# Patient Record
Sex: Female | Born: 1995
Health system: Southern US, Community
[De-identification: ages and names within clinical notes are randomized; demographics above are authoritative.]

---
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
## 2017-03-18 MED FILL — CAZIANT 28 DAY TABLET: 0.1/0.125/0 | 84 days supply | Qty: 84 | Fill #4

## 2017-06-15 DIAGNOSIS — Z309 Encounter for contraceptive management, unspecified: Secondary | ICD-10-CM | POA: Diagnosis not present

## 2017-06-15 MED FILL — CAZIANT 28 DAY TABLET: 0.1/0.125/0 | 84 days supply | Qty: 84 | Fill #0

## 2017-07-16 DIAGNOSIS — Z0000 Encounter for general adult medical examination without abnormal findings: Secondary | ICD-10-CM | POA: Diagnosis not present

## 2017-07-16 DIAGNOSIS — Z309 Encounter for contraceptive management, unspecified: Secondary | ICD-10-CM | POA: Diagnosis not present

## 2017-08-30 MED FILL — CAZIANT 28 DAY TABLET: 0.1/0.125/0 | 84 days supply | Qty: 84 | Fill #1

## 2017-11-22 MED FILL — CAZIANT 28 DAY TABLET: 0.1/0.125/0 | 84 days supply | Qty: 84 | Fill #2

## 2018-02-14 MED FILL — CAZIANT 28 DAY TABLET: 0.1/0.125/0 | 84 days supply | Qty: 84 | Fill #3

## 2018-05-06 MED FILL — VELIVET 28 DAY TABLET: 0.1/0.125/0 | 84 days supply | Qty: 84 | Fill #0

## 2018-08-02 DIAGNOSIS — Z309 Encounter for contraceptive management, unspecified: Secondary | ICD-10-CM | POA: Diagnosis not present

## 2018-08-02 DIAGNOSIS — Z0000 Encounter for general adult medical examination without abnormal findings: Secondary | ICD-10-CM | POA: Diagnosis not present

## 2018-08-02 DIAGNOSIS — Z23 Encounter for immunization: Secondary | ICD-10-CM | POA: Diagnosis not present

## 2018-08-02 MED FILL — VELIVET 28 DAY TABLET: 0.1/0.125/0 | 84 days supply | Qty: 84 | Fill #0

## 2018-10-19 MED FILL — VELIVET 28 DAY TABLET: 0.1/0.125/0 | 84 days supply | Qty: 84 | Fill #1

## 2018-12-02 DIAGNOSIS — Z6841 Body Mass Index (BMI) 40.0 and over, adult: Secondary | ICD-10-CM | POA: Diagnosis not present

## 2018-12-02 DIAGNOSIS — N910 Primary amenorrhea: Secondary | ICD-10-CM | POA: Diagnosis not present

## 2019-01-10 MED FILL — VELIVET 28 DAY TABLET: 0.1/0.125/0 | 84 days supply | Qty: 84 | Fill #2

## 2019-04-04 MED FILL — VELIVET 28 DAY TABLET: 0.1/0.125/0 | 84 days supply | Qty: 84 | Fill #3

## 2019-06-27 MED FILL — VELIVET 28 DAY TABLET: 0.1/0.125/0 | 84 days supply | Qty: 84 | Fill #0

## 2019-08-04 DIAGNOSIS — Z0000 Encounter for general adult medical examination without abnormal findings: Secondary | ICD-10-CM | POA: Diagnosis not present

## 2019-08-04 DIAGNOSIS — Z6841 Body Mass Index (BMI) 40.0 and over, adult: Secondary | ICD-10-CM | POA: Diagnosis not present

## 2019-08-04 DIAGNOSIS — Z309 Encounter for contraceptive management, unspecified: Secondary | ICD-10-CM | POA: Diagnosis not present

## 2019-08-14 DIAGNOSIS — Z13220 Encounter for screening for lipoid disorders: Secondary | ICD-10-CM | POA: Diagnosis not present

## 2019-08-14 DIAGNOSIS — Z0000 Encounter for general adult medical examination without abnormal findings: Secondary | ICD-10-CM | POA: Diagnosis not present

## 2019-08-14 DIAGNOSIS — Z23 Encounter for immunization: Secondary | ICD-10-CM | POA: Diagnosis not present

## 2019-09-19 MED FILL — VELIVET 28 DAY TABLET: 0.1/0.125/0 | 84 days supply | Qty: 84 | Fill #0

## 2019-10-09 DIAGNOSIS — R946 Abnormal results of thyroid function studies: Secondary | ICD-10-CM | POA: Diagnosis not present

## 2019-10-17 ENCOUNTER — Other Ambulatory Visit: Payer: Self-pay | Admitting: Physician Assistant

## 2019-10-17 ENCOUNTER — Other Ambulatory Visit (HOSPITAL_COMMUNITY): Payer: Self-pay | Admitting: Physician Assistant

## 2019-10-17 DIAGNOSIS — R946 Abnormal results of thyroid function studies: Secondary | ICD-10-CM

## 2019-11-06 ENCOUNTER — Ambulatory Visit (HOSPITAL_COMMUNITY): Payer: 59

## 2019-11-06 ENCOUNTER — Other Ambulatory Visit (HOSPITAL_COMMUNITY): Payer: Self-pay

## 2019-11-07 ENCOUNTER — Other Ambulatory Visit (HOSPITAL_COMMUNITY): Payer: Self-pay

## 2019-11-13 ENCOUNTER — Encounter (HOSPITAL_COMMUNITY)
Admission: RE | Admit: 2019-11-13 | Discharge: 2019-11-13 | Disposition: A | Discharge status: Home / Self Care | Payer: 59 | Source: Ambulatory Visit | Attending: Physician Assistant | Admitting: Physician Assistant

## 2019-11-13 ENCOUNTER — Other Ambulatory Visit: Payer: Self-pay

## 2019-11-13 DIAGNOSIS — R946 Abnormal results of thyroid function studies: Secondary | ICD-10-CM | POA: Insufficient documentation

## 2019-11-13 MED ORDER — SODIUM IODIDE I 131 CAPSULE
12.0000 | Freq: Once | INTRAVENOUS | Status: AC | PRN
  Administered 2019-11-13: 12 via ORAL

## 2019-11-14 ENCOUNTER — Encounter (HOSPITAL_COMMUNITY)
Admission: RE | Admit: 2019-11-14 | Discharge: 2019-11-14 | Disposition: A | Discharge status: Home / Self Care | Payer: 59 | Source: Ambulatory Visit | Attending: Physician Assistant | Admitting: Physician Assistant

## 2019-11-14 DIAGNOSIS — E0590 Thyrotoxicosis, unspecified without thyrotoxic crisis or storm: Secondary | ICD-10-CM | POA: Diagnosis not present

## 2019-11-14 MED ORDER — SODIUM PERTECHNETATE TC 99M INJECTION
10.9000 | Freq: Once | INTRAVENOUS | Status: AC
  Administered 2019-11-14: 10.9 via INTRAVENOUS

## 2019-11-27 DIAGNOSIS — N62 Hypertrophy of breast: Secondary | ICD-10-CM | POA: Diagnosis not present

## 2019-11-27 DIAGNOSIS — M546 Pain in thoracic spine: Secondary | ICD-10-CM | POA: Diagnosis not present

## 2020-01-11 ENCOUNTER — Other Ambulatory Visit: Payer: Self-pay

## 2020-01-12 NOTE — Progress Notes (Signed)
Name: Brittany Gross  MRN/ DOB: [id number], 1996/01/13    Age/ Sex: 24 y.o., female    PCP: Milus Height, PA   Reason for Endocrinology Evaluation: Hyperthyroidism     Date of Initial Endocrinology Evaluation: 01/15/2020     HPI: Brittany Gross is a 24 y.o. female with unremarkable past medical history. The patient presented for initial endocrinology clinic visit on 01/15/2020 for consultative assistance with her hyperthyroidism   Pt presented to her PCP for a routine check up in 07/2019 and was noted to have a low TSH at 0.24 uIu/ml , which prompted a thyroid uptake and scan with an elevated 24-hour uptake at 33.1%.  Patient denied any hyperthyroid symptoms at the time.  Today she continues to deny weight loss, heat intolerance, or diarrhea.  She has occasional palpitations and chronic anxiety.  Denies any local neck symptoms or eye symptoms. Maternal grandmother and maternal aunt with thyroid disease.     HISTORY:   Past Medical History: No past medical history on file.  Past Surgical History:    Social History:  reports that she has never smoked. She has never used smokeless tobacco. She reports current alcohol use. She reports that she does not use drugs.  Family History: family history includes Hyperthyroidism in her maternal grandmother.   HOME MEDICATIONS: Allergies as of 01/15/2020   No Known Allergies     Medication List    as of January 15, 2020  3:16 PM   You have not been prescribed any medications.       REVIEW OF SYSTEMS: A comprehensive ROS was conducted with the patient and is negative except as per HPI    OBJECTIVE:  VS: BP 118/78 (BP Location: Right Arm, Patient Position: Sitting, Cuff Size: Large)   Pulse 86   Temp 98 F (36.7 C)   Ht 5\' 4"  (1.626 m)   Wt 245 lb 6.4 oz (111.3 kg)   SpO2 98%   BMI 42.12 kg/m    Wt Readings from Last 3 Encounters:  01/15/20 245 lb 6.4 oz (111.3 kg)     EXAM: General: Pt appears well and is in  NAD  Eyes: External eye exam normal without stare, lid lag or exophthalmos.  EOM intact.    Neck: General: Supple without adenopathy. Thyroid: Thyroid size enlarged~ 60 grams.  No nodules appreciated. No thyroid bruit.  Lungs: Clear with good BS bilat with no rales, rhonchi, or wheezes  Heart: Auscultation: RRR.  Abdomen: Normoactive bowel sounds, soft, nontender, without masses or organomegaly palpable  Extremities:  BL LE: No pretibial edema normal ROM and strength.  Skin: Hair: Texture and amount normal with gender appropriate distribution Skin Inspection: No rashes Skin Palpation: Skin temperature, texture, and thickness normal to palpation  Neuro: Cranial nerves: II - XII grossly intact  Motor: Normal strength throughout DTRs: 2+ and symmetric in UE without delay in relaxation phase  Mental Status: Judgment, insight: Intact Orientation: Oriented to time, place, and person Mood and affect: No depression, anxiety, or agitation     DATA REVIEWED:   Results for Brittany Gross (MRN Vicenta Aly) as of 01/16/2020 10:29  Ref. Range 01/15/2020 08:04  Sodium Latest Ref Range: 135 - 145 mEq/L 139  Potassium Latest Ref Range: 3.5 - 5.1 mEq/L 4.2  Chloride Latest Ref Range: 96 - 112 mEq/L 106  CO2 Latest Ref Range: 19 - 32 mEq/L 28  Glucose Latest Ref Range: 70 - 99 mg/dL 95  BUN Latest Ref Range: 6 -  23 mg/dL 7  Creatinine Latest Ref Range: 0.40 - 1.20 mg/dL 0.35  Calcium Latest Ref Range: 8.4 - 10.5 mg/dL 9.6  Alkaline Phosphatase Latest Ref Range: 39 - 117 U/L 61  Albumin Latest Ref Range: 3.5 - 5.2 g/dL 3.8  AST Latest Ref Range: 0 - 37 U/L 17  ALT Latest Ref Range: 0 - 35 U/L 12  Total Protein Latest Ref Range: 6.0 - 8.3 g/dL 7.0  Total Bilirubin Latest Ref Range: 0.2 - 1.2 mg/dL 0.3  GFR Latest Ref Range: >60.00 mL/min 135.91  WBC Latest Ref Range: 4.0 - 10.5 K/uL 5.2  RBC Latest Ref Range: 3.87 - 5.11 Mil/uL 4.45  Hemoglobin Latest Ref Range: 12.0 - 15.0 g/dL 00.9 (L)  HCT  Latest Ref Range: 36.0 - 46.0 % 35.4 (L)  MCV Latest Ref Range: 78.0 - 100.0 fl 79.6  MCHC Latest Ref Range: 30.0 - 36.0 g/dL 38.1  RDW Latest Ref Range: 11.5 - 15.5 % 16.9 (H)  Platelets Latest Ref Range: 150.0 - 400.0 K/uL 305.0  Neutrophils Latest Ref Range: 43.0 - 77.0 % 36.2 (L)  Lymphocytes Latest Ref Range: 12.0 - 46.0 % 54.9 (H)  Monocytes Relative Latest Ref Range: 3.0 - 12.0 % 7.6  Eosinophil Latest Ref Range: 0.0 - 5.0 % 0.8  Basophil Latest Ref Range: 0.0 - 3.0 % 0.5  NEUT# Latest Ref Range: 1.4 - 7.7 K/uL 1.9  Lymphocyte # Latest Ref Range: 0.7 - 4.0 K/uL 2.9  Monocyte # Latest Ref Range: 0.1 - 1.0 K/uL 0.4  Eosinophils Absolute Latest Ref Range: 0.0 - 0.7 K/uL 0.0  Basophils Absolute Latest Ref Range: 0.0 - 0.1 K/uL 0.0  TSH Latest Ref Range: 0.35 - 4.50 uIU/mL 0.32 (L)  Triiodothyronine (T3) Latest Ref Range: 76 - 181 ng/dL 829  H3,ZJIR(CVELFY) Latest Ref Range: 0.60 - 1.60 ng/dL 1.01    7/51/0258 TSH 5.27 uIU/mL     Thyroid uptake and scan 11/14/2019 4 hour I-131 uptake = 19.3% (normal 5-15%)  24 hour I-131 uptake = 33.1% (normal 10-30%)   Old records , labs and images have been reviewed.   ASSESSMENT/PLAN/RECOMMENDATIONS:   1. 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   We discussed with pt the benefits of methimazole in the Tx of hyperthyroidism, as well as the possible side effects/complications of anti-thyroid drug Tx (specifically detailing the rare, but serious side effect of agranulocytosis). She was informed of need for regular thyroid function monitoring while on methimazole to ensure appropriate dosage without over-treatment. As well, we discussed the possible side effects of methimazole including the chance of rash, the small chance of liver irritation/juandice and the <=1 in  300-400 chance of sudden onset agranulocytosis.  We discussed importance of going to ED promptly (and stopping methimazole) if shewere to develop significant fever with severe sore throat of other evidence of acute infection.     We extensively discussed the various treatment options for hyperthyroidism and Graves disease including ablation therapy with radioactive iodine versus antithyroid drug treatment versus surgical therapy.  We recommended to the patient that we felt, at this time, that  NO  therapy would be most optimal.  Since she has no symptoms and her TSH is minimally below normal.      Follow-up in 4 months  Addendum: discussed results with the pt 01/16/2020 at 10:30 Am  Signed electronically by: Mack Guise, MD  [facility] Endocrinology  [facility] [address] [place], [place] [address] Phone: [phone] FAX: [phone]   CC: Lennie Odor, [place] [employer] [place] [address] Phone: [phone] Fax: [phone]   Return to Endocrinology clinic as below: Future Appointments  Date Time Provider [place]  05/16/2020  7:30 AM Analilia Geddis, Melanie Crazier, MD [facility] None

## 2020-01-15 ENCOUNTER — Encounter: Payer: Self-pay | Admitting: Internal Medicine

## 2020-01-15 ENCOUNTER — Other Ambulatory Visit: Payer: Self-pay

## 2020-01-15 ENCOUNTER — Ambulatory Visit (INDEPENDENT_AMBULATORY_CARE_PROVIDER_SITE_OTHER): Payer: 59 | Admitting: Internal Medicine

## 2020-01-15 VITALS — BP 118/78 | HR 86 | Temp 98.0°F | Ht 64.0 in | Wt 245.4 lb

## 2020-01-15 DIAGNOSIS — E0590 Thyrotoxicosis, unspecified without thyrotoxic crisis or storm: Secondary | ICD-10-CM | POA: Diagnosis not present

## 2020-01-15 LAB — COMPREHENSIVE METABOLIC PANEL
ALT: 12 U/L (ref 0–35)
AST: 17 U/L (ref 0–37)
Albumin: 3.8 g/dL (ref 3.5–5.2)
Alkaline Phosphatase: 61 U/L (ref 39–117)
BUN: 7 mg/dL (ref 6–23)
CO2: 28 mEq/L (ref 19–32)
Calcium: 9.6 mg/dL (ref 8.4–10.5)
Chloride: 106 mEq/L (ref 96–112)
Creatinine, Ser: 0.66 mg/dL (ref 0.40–1.20)
GFR: 135.91 mL/min (ref >60.00)
Glucose, Bld: 95 mg/dL (ref 70–99)
Potassium: 4.2 mEq/L (ref 3.5–5.1)
Sodium: 139 mEq/L (ref 135–145)
Total Bilirubin: 0.3 mg/dL (ref 0.2–1.2)
Total Protein: 7 g/dL (ref 6.0–8.3)

## 2020-01-15 LAB — CBC WITH DIFFERENTIAL/PLATELET
Basophils Absolute: 0 10*3/uL (ref 0.0–0.1)
Basophils Relative: 0.5 % (ref 0.0–3.0)
Eosinophils Absolute: 0 10*3/uL (ref 0.0–0.7)
Eosinophils Relative: 0.8 % (ref 0.0–5.0)
HCT: 35.4 % — ABNORMAL LOW (ref 36.0–46.0)
Hemoglobin: 11.5 g/dL — ABNORMAL LOW (ref 12.0–15.0)
Lymphocytes Relative: 54.9 % — ABNORMAL HIGH (ref 12.0–46.0)
Lymphs Abs: 2.9 10*3/uL (ref 0.7–4.0)
MCHC: 32.4 g/dL (ref 30.0–36.0)
MCV: 79.6 fl (ref 78.0–100.0)
Monocytes Absolute: 0.4 10*3/uL (ref 0.1–1.0)
Monocytes Relative: 7.6 % (ref 3.0–12.0)
Neutro Abs: 1.9 10*3/uL (ref 1.4–7.7)
Neutrophils Relative %: 36.2 % — ABNORMAL LOW (ref 43.0–77.0)
Platelets: 305 10*3/uL (ref 150.0–400.0)
RBC: 4.45 Mil/uL (ref 3.87–5.11)
RDW: 16.9 % — ABNORMAL HIGH (ref 11.5–15.5)
WBC: 5.2 10*3/uL (ref 4.0–10.5)

## 2020-01-15 LAB — T4, FREE: Free T4: 0.86 ng/dL (ref 0.60–1.60)

## 2020-01-15 LAB — TSH: TSH: 0.32 u[IU]/mL — ABNORMAL LOW (ref 0.35–4.50)

## 2020-01-15 NOTE — Patient Instructions (Addendum)
-   Please stop by the lab today  

## 2020-01-16 DIAGNOSIS — E0590 Thyrotoxicosis, unspecified without thyrotoxic crisis or storm: Secondary | ICD-10-CM | POA: Insufficient documentation

## 2020-01-18 LAB — TRAB (TSH RECEPTOR BINDING ANTIBODY): TRAB: <1 IU/L (ref <=2.00)

## 2020-01-18 LAB — T3: T3, Total: 128 ng/dL (ref 76–181)

## 2020-05-16 ENCOUNTER — Other Ambulatory Visit: Payer: Self-pay

## 2020-05-16 ENCOUNTER — Ambulatory Visit (INDEPENDENT_AMBULATORY_CARE_PROVIDER_SITE_OTHER): Payer: 59 | Admitting: Internal Medicine

## 2020-05-16 VITALS — BP 118/74 | HR 74 | Ht 64.0 in | Wt 245.0 lb

## 2020-05-16 DIAGNOSIS — E0590 Thyrotoxicosis, unspecified without thyrotoxic crisis or storm: Secondary | ICD-10-CM | POA: Diagnosis not present

## 2020-05-16 LAB — T4, FREE: Free T4: 0.87 ng/dL (ref 0.60–1.60)

## 2020-05-16 LAB — TSH: TSH: 0.22 u[IU]/mL — ABNORMAL LOW (ref 0.35–4.50)

## 2020-05-16 NOTE — Progress Notes (Signed)
Name: Brittany Gross  MRN/ DOB: [id number], 11-08-1996    Age/ Sex: 24 y.o., female     PCP: Milus Height, PA   Reason for Endocrinology Evaluation: Hyperthyroidism     Initial Endocrinology Clinic Visit: 01/15/2020    PATIENT IDENTIFIER: Brittany Gross is a 24 y.o., female with unremarkable  past medical history  She has followed with [facility] since 01/15/2020 for consultative assistance with management of her hyperthyroidism.   HISTORICAL SUMMARY:  Pt presented to her PCP for a routine check up in 07/2019 and was noted to have a low TSH at 0.24 uIu/ml , which prompted a thyroid uptake and scan with an elevated 24-hour uptake at 33.1%.   Pt was asymptomatic  Maternal grandmother and maternal aunt with thyroid disease  SUBJECTIVE:    Today (05/16/2020):  Brittany Gross is here for Subclinical hyperthyroidism She denies any weight loss, palpitations, diarrhea or tremors.   No local neck symptoms  ROS:  As per HPI.   HISTORY:   Past Medical History: No past medical history on file.  Past Surgical History:   Social History:  reports that she has never smoked. She has never used smokeless tobacco. She reports current alcohol use. She reports that she does not use drugs. Family History:  Family History  Problem Relation Age of Onset   Hyperthyroidism Maternal Grandmother       HOME MEDICATIONS: Allergies as of 05/16/2020   No Known Allergies     Medication List    as of May 16, 2020  7:32 AM   You have not been prescribed any medications.       OBJECTIVE:   PHYSICAL EXAM: VS: BP 118/74 (BP Location: Left Arm, Patient Position: Sitting, Cuff Size: Large)    Pulse 74    Ht 5\' 4"  (1.626 m)    Wt 245 lb (111.1 kg)    LMP 04/16/2020 (Approximate)    SpO2 99%    BMI 42.05 kg/m    EXAM: General: Pt appears well and is in NAD  Eyes: External eye exam normal without stare, lid lag or exophthalmos.  EOM intact.    Neck: General: Supple  without adenopathy. Thyroid: Thyroid gland is prominent. No goiter or nodules appreciated.   Lungs: Clear with good BS bilat with no rales, rhonchi, or wheezes  Heart: Auscultation: RRR.  Abdomen: Normoactive bowel sounds, soft, nontender, without masses or organomegaly palpable  Extremities:  BL LE: No pretibial edema normal ROM and strength.  Mental Status: Judgment, insight: Intact Orientation: Oriented to time, place, and person Mood and affect: No depression, anxiety, or agitation     DATA REVIEWED: Results for IVONNE, FREEBURG (MRN Vicenta Aly) as of 05/17/2020 10:34  Ref. Range 05/16/2020 07:49  TSH Latest Ref Range: 0.35 - 4.50 uIU/mL 0.22 (L)  Triiodothyronine (T3) Latest Ref Range: 76 - 181 ng/dL 07/17/2020  025) Latest Ref Range: 0.60 - 1.60 ng/dL K2,HCWC(BJSEGB      Thyroid Uptake scan 05/16/2020 Normal thyroid scan. No hot or cold thyroid lesions are identified.  4 hour I-131 uptake = 19.3% (normal 5-15%)  24 hour I-131 uptake = 33.1% (normal 10-30%)  IMPRESSION: Elevated 4 and 24 hour iodine 131 uptake suggesting hyperthyroidism.  ASSESSMENT / PLAN / RECOMMENDATIONS:   1. Subclinical Hyperthyroidism:  - Most likely secondary to subclinical graves' disease  - Clinically she remains euthyroid  - No local neck symptoms  - TSH worsening, we opted to treat at this time with methimazole   -We  discussed that Graves' Disease is a result of an autoimmune condition involving the thyroid.    -We discussed with pt the benefits of methimazole in the Tx of hyperthyroidism, as well as the possible side effects/complications of anti-thyroid drug Tx (specifically detailing the rare, but serious side effect of agranulocytosis). She was informed of need for regular thyroid function monitoring while on methimazole to ensure appropriate dosage without over-treatment. As well, we discussed the possible side effects of methimazole including the chance of rash, the small chance of liver  irritation/juandice and the <=1 in 300-400 chance of sudden onset agranulocytosis.  We discussed importance of going to ED promptly (and stopping methimazole) if shewere to develop significant fever with severe sore throat of other evidence of acute infection.     Medications   Methimazole 5 mg daily    F/U in 4 months   Addendum: discussed results with the on 05/17/2020 at 1030 AM     Signed electronically by: Lyndle Herrlich, MD  [facility] Endocrinology  [facility] [address] [place] [place], [place] [address] Phone: [phone] FAX: [phone]      CC: Milus Height, [place] [address] [place] [address] Phone: [phone]  Fax: [phone]   Return to Endocrinology clinic as below: No future appointments.

## 2020-05-17 ENCOUNTER — Telehealth: Payer: Self-pay | Admitting: Internal Medicine

## 2020-05-17 ENCOUNTER — Other Ambulatory Visit (HOSPITAL_COMMUNITY): Payer: Self-pay | Admitting: Internal Medicine

## 2020-05-17 LAB — T3: T3, Total: 129 ng/dL (ref 76–181)

## 2020-05-17 MED ORDER — METHIMAZOLE 5 MG PO TABS: 5.0000 mg | ORAL_TABLET | Freq: Every day | ORAL | 6 refills | Status: AC

## 2020-05-17 MED FILL — methIMAzole 5 MG TABS: 5 | 30 days supply | Qty: 30 | Fill #0

## 2020-05-17 NOTE — Telephone Encounter (Signed)
Left a message for a call back on 05/17/2020 at 9:14 AM     Abby Raelyn Mora, MD  [facility] Endocrinology  [facility] [address] [place], [place] [address] Phone: [phone] FAX: [phone]

## 2020-05-17 NOTE — Telephone Encounter (Signed)
Patient returned Dr. Harvel Ricks call and requests to be called at ph# [phone]

## 2020-06-28 MED FILL — methIMAzole 5 MG TABS: 5 | 30 days supply | Qty: 30 | Fill #1

## 2020-08-05 ENCOUNTER — Other Ambulatory Visit (HOSPITAL_COMMUNITY)
Admission: RE | Admit: 2020-08-05 | Discharge: 2020-08-05 | Disposition: A | Discharge status: Home / Self Care | Payer: BC Managed Care – PPO | Source: Ambulatory Visit | Attending: Physician Assistant | Admitting: Physician Assistant

## 2020-08-05 DIAGNOSIS — D649 Anemia, unspecified: Secondary | ICD-10-CM | POA: Diagnosis not present

## 2020-08-05 DIAGNOSIS — Z124 Encounter for screening for malignant neoplasm of cervix: Secondary | ICD-10-CM | POA: Diagnosis not present

## 2020-08-05 DIAGNOSIS — Z0000 Encounter for general adult medical examination without abnormal findings: Secondary | ICD-10-CM | POA: Diagnosis not present

## 2020-08-05 DIAGNOSIS — E0590 Thyrotoxicosis, unspecified without thyrotoxic crisis or storm: Secondary | ICD-10-CM | POA: Diagnosis not present

## 2020-08-07 LAB — CYTOLOGY - PAP: Diagnosis: NEGATIVE

## 2020-09-18 MED FILL — methIMAzole 5 MG TABS: 5 | 30 days supply | Qty: 30 | Fill #2

## 2020-09-20 ENCOUNTER — Ambulatory Visit: Payer: 59 | Admitting: Internal Medicine

## 2020-09-20 NOTE — Progress Notes (Deleted)
Name: Brittany Gross  MRN/ DOB: [id number], Jun 14, 1996    Age/ Sex: 24 y.o., female     PCP: Milus Height, PA   Reason for Endocrinology Evaluation: Hyperthyroidism     Initial Endocrinology Clinic Visit: 01/15/2020    PATIENT IDENTIFIER: Brittany Gross is a 24 y.o., female with unremarkable  past medical history  She has followed with [facility] since 01/15/2020 for consultative assistance with management of her hyperthyroidism.   HISTORICAL SUMMARY:  Pt presented to her PCP for a routine check up in 07/2019 and was noted to have a low TSH at 0.24 uIu/ml , which prompted a thyroid uptake and scan with an elevated 24-hour uptake at 33.1%.   Pt was asymptomatic  Maternal grandmother and maternal aunt with thyroid disease  SUBJECTIVE:    Today (09/20/2020):  Brittany Gross is here for Subclinical hyperthyroidism She denies any weight loss, palpitations, diarrhea or tremors.   No local neck symptoms   HISTORY:   Past Medical History: No past medical history on file.  Past Surgical History:   Social History:  reports that she has never smoked. She has never used smokeless tobacco. She reports current alcohol use. She reports that she does not use drugs. Family History:  Family History  Problem Relation Age of Onset  . Hyperthyroidism Maternal Grandmother      HOME MEDICATIONS: Allergies as of 09/20/2020   No Known Allergies     Medication List       Accurate as of September 20, 2020  7:14 AM. If you have any questions, ask your nurse or doctor.        methimazole 5 MG tablet Commonly known as: TAPAZOLE Take 1 tablet (5 mg total) by mouth daily.         OBJECTIVE:   PHYSICAL EXAM: VS: There were no vitals taken for this visit.   EXAM: General: Pt appears well and is in NAD  Eyes: External eye exam normal without stare, lid lag or exophthalmos.  EOM intact.    Neck: General: Supple without adenopathy. Thyroid: Thyroid gland is  prominent. No goiter or nodules appreciated.   Lungs: Clear with good BS bilat with no rales, rhonchi, or wheezes  Heart: Auscultation: RRR.  Abdomen: Normoactive bowel sounds, soft, nontender, without masses or organomegaly palpable  Extremities:  BL LE: No pretibial edema normal ROM and strength.  Mental Status: Judgment, insight: Intact Orientation: Oriented to time, place, and person Mood and affect: No depression, anxiety, or agitation     DATA REVIEWED: Results for DEMETRI, KERMAN (MRN [id number]) as of 05/17/2020 10:34  Ref. Range 05/16/2020 07:49  TSH Latest Ref Range: 0.35 - 4.50 uIU/mL 0.22 (L)  Triiodothyronine (T3) Latest Ref Range: 76 - 181 ng/dL 106  Y6,RSWN(IOEVOJ) Latest Ref Range: 0.60 - 1.60 ng/dL 5.00      Thyroid Uptake scan 05/16/2020 Normal thyroid scan. No hot or cold thyroid lesions are identified.  4 hour I-131 uptake = 19.3% (normal 5-15%)  24 hour I-131 uptake = 33.1% (normal 10-30%)  IMPRESSION: Elevated 4 and 24 hour iodine 131 uptake suggesting hyperthyroidism.  ASSESSMENT / PLAN / RECOMMENDATIONS:   1. Subclinical Hyperthyroidism:  - Most likely secondary to subclinical graves' disease  - Clinically she remains euthyroid  - No local neck symptoms  - TSH worsening, we opted to treat at this time with methimazole   -We discussed that Graves' Disease is a result of an autoimmune condition involving the thyroid.    -We discussed  with pt the benefits of methimazole in the Tx of hyperthyroidism, as well as the possible side effects/complications of anti-thyroid drug Tx (specifically detailing the rare, but serious side effect of agranulocytosis). She was informed of need for regular thyroid function monitoring while on methimazole to ensure appropriate dosage without over-treatment. As well, we discussed the possible side effects of methimazole including the chance of rash, the small chance of liver irritation/juandice and the <=1 in 300-400 chance of  sudden onset agranulocytosis.  We discussed importance of going to ED promptly (and stopping methimazole) if shewere to develop significant fever with severe sore throat of other evidence of acute infection.     Medications   Methimazole 5 mg daily    F/U in 4 months   Addendum: discussed results with the on 05/17/2020 at 1030 AM     Signed electronically by: Lyndle Herrlich, MD  [facility] Endocrinology  [facility] [address] [place] [place], [place] [address] Phone: [phone] FAX: [phone]      CC: Milus Height, [place] [address] [place] [address] Phone: [phone]  Fax: [phone]   Return to Endocrinology clinic as below: Future Appointments  Date Time Provider Department Center  09/20/2020  7:30 AM Shey Bartmess, Konrad Dolores, MD [facility] None

## 2021-01-07 MED FILL — methIMAzole 5 MG TABS: 5 | 30 days supply | Qty: 30 | Fill #3

## 2021-08-12 DIAGNOSIS — Z0000 Encounter for general adult medical examination without abnormal findings: Secondary | ICD-10-CM | POA: Diagnosis not present

## 2021-08-12 DIAGNOSIS — Z13220 Encounter for screening for lipoid disorders: Secondary | ICD-10-CM | POA: Diagnosis not present

## 2021-08-12 DIAGNOSIS — Z124 Encounter for screening for malignant neoplasm of cervix: Secondary | ICD-10-CM | POA: Diagnosis not present

## 2021-08-12 DIAGNOSIS — E0590 Thyrotoxicosis, unspecified without thyrotoxic crisis or storm: Secondary | ICD-10-CM | POA: Diagnosis not present

## 2021-09-05 ENCOUNTER — Other Ambulatory Visit: Payer: Self-pay

## 2021-09-05 ENCOUNTER — Ambulatory Visit
Admission: RE | Admit: 2021-09-05 | Discharge: 2021-09-05 | Disposition: A | Discharge status: Home / Self Care | Payer: BC Managed Care – PPO | Source: Ambulatory Visit | Attending: Internal Medicine | Admitting: Internal Medicine

## 2021-09-05 VITALS — BP 130/85 | HR 90 | Temp 99.0°F | Resp 18

## 2021-09-05 DIAGNOSIS — L03317 Cellulitis of buttock: Secondary | ICD-10-CM | POA: Diagnosis not present

## 2021-09-05 DIAGNOSIS — L0231 Cutaneous abscess of buttock: Secondary | ICD-10-CM

## 2021-09-05 MED ORDER — DOXYCYCLINE HYCLATE 100 MG PO CAPS: 100.0000 mg | ORAL_CAPSULE | Freq: Two times a day (BID) | ORAL | 0 refills | Status: AC

## 2021-09-05 NOTE — ED Provider Notes (Signed)
[facility]    CSN: [id number] Arrival date & time: 09/05/21  1345      History   Chief Complaint Chief Complaint  Patient presents with   Abscess    HPI Brittany Gross is a 25 y.o. female.   Patient presents with bump to buttocks area that has been present for a few days.  Patient reports that she noticed the area after shaving.  Denies any drainage from the area.  Denies any fevers.  Patient reports it is painful and is interfering with sitting.   Abscess  History reviewed. No pertinent past medical history.  Patient Active Problem List   Diagnosis Date Noted   Hyperthyroidism 01/16/2020    History reviewed. No pertinent surgical history.  OB History   No obstetric history on file.      Home Medications    Prior to Admission medications   Medication Sig Start Date End Date Taking? Authorizing Provider  doxycycline (VIBRAMYCIN) 100 MG capsule Take 1 capsule (100 mg total) by mouth 2 (two) times daily. 09/05/21  Yes Lior Hoen, Rolly Salter E, FNP  methimazole (TAPAZOLE) 5 MG tablet Take 1 tablet (5 mg total) by mouth daily. 05/17/20   Shamleffer, Konrad Dolores, MD  methimazole (TAPAZOLE) 5 MG tablet TAKE 1 TABLET BY MOUTH ONCE DAILY 05/17/20 05/17/21  Shamleffer, Konrad Dolores, MD    Family History Family History  Problem Relation Age of Onset   Hyperthyroidism Maternal Grandmother     Social History Social History   Tobacco Use   Smoking status: Never   Smokeless tobacco: Never  Substance Use Topics   Alcohol use: Yes   Drug use: Never     Allergies   Patient has no known allergies.   Review of Systems Review of Systems Per HPI  Physical Exam Triage Vital Signs ED Triage Vitals  Enc Vitals Group     BP 09/05/21 1433 130/85     Pulse Rate 09/05/21 1433 90     Resp 09/05/21 1433 18     Temp 09/05/21 1433 99 F (37.2 C)     Temp Source 09/05/21 1433 Oral     SpO2 09/05/21 1433 98 %     Weight --      Height --      Head  Circumference --      Peak Flow --      Pain Score 09/05/21 1424 5     Pain Loc --      Pain Edu? --      Excl. in GC? --    No data found.  Updated Vital Signs BP 130/85 (BP Location: Left Arm)   Pulse 90   Temp 99 F (37.2 C) (Oral)   Resp 18   SpO2 98%   Visual Acuity Right Eye Distance:   Left Eye Distance:   Bilateral Distance:    Right Eye Near:   Left Eye Near:    Bilateral Near:     Physical Exam Exam conducted with a chaperone present.  Constitutional:      General: She is not in acute distress.    Appearance: Normal appearance. She is not toxic-appearing or diaphoretic.  HENT:     Head: Normocephalic and atraumatic.  Eyes:     Extraocular Movements: Extraocular movements intact.     Conjunctiva/sclera: Conjunctivae normal.  Pulmonary:     Effort: Pulmonary effort is normal.  Skin:    General: Skin is warm and dry.     Findings: Abscess  present.          Comments: Approximately 2 cm x 1.5 cm in diameter indurated abscess present to buttocks on area as shown on diagram.  No drainage noted.  Neurological:     General: No focal deficit present.     Mental Status: She is alert and oriented to person, place, and time. Mental status is at baseline.  Psychiatric:        Mood and Affect: Mood normal.        Behavior: Behavior normal.        Thought Content: Thought content normal.        Judgment: Judgment normal.     UC Treatments / Results  Labs (all labs ordered are listed, but only abnormal results are displayed) Labs Reviewed - No data to display  EKG   Radiology No results found.  Procedures Procedures (including critical care time)  Medications Ordered in UC Medications - No data to display  Initial Impression / Assessment and Plan / UC Course  I have reviewed the triage vital signs and the nursing notes.  Pertinent labs & imaging results that were available during my care of the patient were reviewed by me and considered in my  medical decision making (see chart for details).     No I&D necessary at this time as abscess is indurated and is not able to be drained.  Will prescribe doxycycline x10 days.  Advised patient to use warm compresses and/or warm Epson salt soaks to soften as well.  Patient advised to follow-up at the emergency department if no improvement in the next 48 hours due to location and appearance of abscess. Discussed strict return precautions. Patient verbalized understanding and is agreeable with plan.  Final Clinical Impressions(s) / UC Diagnoses   Final diagnoses:  Cellulitis and abscess of buttock     Discharge Instructions      You have been prescribed doxycycline antibiotic to treat infection.  Please use also warm compresses or warm Epson salt soaks.  Go to the hospital if no improvement in the next 48 hours.     ED Prescriptions     Medication Sig Dispense Auth. Provider   doxycycline (VIBRAMYCIN) 100 MG capsule Take 1 capsule (100 mg total) by mouth 2 (two) times daily. 20 capsule Gustavus Bryant, [place]      PDMP not reviewed this encounter.   Gustavus Bryant, [place] 09/05/21 [phone]

## 2021-09-05 NOTE — Discharge Instructions (Addendum)
You have been prescribed doxycycline antibiotic to treat infection.  Please use also warm compresses or warm Epson salt soaks.  Go to the hospital if no improvement in the next 48 hours.

## 2021-09-05 NOTE — ED Triage Notes (Signed)
Pt c/o "bump" to buttocks after shaving located at the upper right of the medial glute area. States it is tender and solid, denies drainage. Says it is painful to do ADLs.

## 2021-09-07 ENCOUNTER — Other Ambulatory Visit: Payer: Self-pay

## 2021-09-07 ENCOUNTER — Ambulatory Visit
Admission: RE | Admit: 2021-09-07 | Discharge: 2021-09-07 | Disposition: A | Discharge status: Home / Self Care | Payer: BC Managed Care – PPO | Source: Ambulatory Visit | Attending: Physician Assistant | Admitting: Physician Assistant
